# Patient Record
Sex: Male | Born: 1975 | Race: Black or African American | Hispanic: Yes | Marital: Married | Smoking: Never smoker
Health system: Southern US, Community
[De-identification: ages and names within clinical notes are randomized; demographics above are authoritative.]

## PROBLEM LIST (undated history)

## (undated) DIAGNOSIS — G40909 Epilepsy, unspecified, not intractable, without status epilepticus: Secondary | ICD-10-CM

## (undated) HISTORY — PX: ANTERIOR CRUCIATE LIGAMENT REPAIR: SHX115

---

## 2013-05-06 ENCOUNTER — Encounter (HOSPITAL_COMMUNITY): Payer: Self-pay | Admitting: *Deleted

## 2013-05-06 ENCOUNTER — Emergency Department (HOSPITAL_COMMUNITY)
Admission: EM | Admit: 2013-05-06 | Discharge: 2013-05-06 | Disposition: A | Discharge status: Home / Self Care | Payer: BC Managed Care – PPO | Attending: Emergency Medicine | Admitting: Emergency Medicine

## 2013-05-06 DIAGNOSIS — R509 Fever, unspecified: Secondary | ICD-10-CM | POA: Insufficient documentation

## 2013-05-06 DIAGNOSIS — L02419 Cutaneous abscess of limb, unspecified: Secondary | ICD-10-CM | POA: Insufficient documentation

## 2013-05-06 DIAGNOSIS — R112 Nausea with vomiting, unspecified: Secondary | ICD-10-CM | POA: Insufficient documentation

## 2013-05-06 DIAGNOSIS — L03116 Cellulitis of left lower limb: Secondary | ICD-10-CM

## 2013-05-06 DIAGNOSIS — M7989 Other specified soft tissue disorders: Secondary | ICD-10-CM

## 2013-05-06 DIAGNOSIS — M79609 Pain in unspecified limb: Secondary | ICD-10-CM

## 2013-05-06 DIAGNOSIS — Z8669 Personal history of other diseases of the nervous system and sense organs: Secondary | ICD-10-CM | POA: Insufficient documentation

## 2013-05-06 DIAGNOSIS — R197 Diarrhea, unspecified: Secondary | ICD-10-CM | POA: Insufficient documentation

## 2013-05-06 HISTORY — DX: Epilepsy, unspecified, not intractable, without status epilepticus: G40.909

## 2013-05-06 LAB — CBC WITH DIFFERENTIAL/PLATELET
Basophils Absolute: 0.1 10*3/uL (ref 0.0–0.1)
HCT: 44.9 % (ref 39.0–52.0)
Lymphocytes Relative: 16 % (ref 12–46)
Lymphs Abs: 1.9 10*3/uL (ref 0.7–4.0)
MCV: 84.6 fL (ref 78.0–100.0)
Monocytes Absolute: 1.2 10*3/uL — ABNORMAL HIGH (ref 0.1–1.0)
Neutro Abs: 8.3 10*3/uL — ABNORMAL HIGH (ref 1.7–7.7)
RBC: 5.31 MIL/uL (ref 4.22–5.81)
RDW: 14.5 % (ref 11.5–15.5)
WBC: 11.6 10*3/uL — ABNORMAL HIGH (ref 4.0–10.5)

## 2013-05-06 LAB — POCT I-STAT, CHEM 8
BUN: 9 mg/dL (ref 6–23)
Chloride: 108 mEq/L (ref 96–112)
Creatinine, Ser: 1.1 mg/dL (ref 0.50–1.35)
Sodium: 140 mEq/L (ref 135–145)

## 2013-05-06 MED ORDER — CLINDAMYCIN PHOSPHATE 600 MG/50ML IV SOLN
600.0000 mg | Freq: Once | INTRAVENOUS | Status: AC
  Administered 2013-05-06: 600 mg via INTRAVENOUS
  Filled 2013-05-06: qty 50

## 2013-05-06 MED ORDER — HYDROCODONE-ACETAMINOPHEN 5-325 MG PO TABS
1.0000 | ORAL_TABLET | Freq: Once | ORAL | Status: AC
  Administered 2013-05-06: 1 via ORAL
  Filled 2013-05-06: qty 1

## 2013-05-06 MED ORDER — MORPHINE SULFATE 4 MG/ML IJ SOLN
4.0000 mg | Freq: Once | INTRAMUSCULAR | Status: AC
  Administered 2013-05-06: 4 mg via INTRAVENOUS
  Filled 2013-05-06: qty 1

## 2013-05-06 MED ORDER — HYDROCODONE-ACETAMINOPHEN 5-325 MG PO TABS: 1.0000 | ORAL_TABLET | ORAL | Status: AC | PRN

## 2013-05-06 MED ORDER — CLINDAMYCIN HCL 150 MG PO CAPS: 150.0000 mg | ORAL_CAPSULE | Freq: Four times a day (QID) | ORAL | Status: AC

## 2013-05-06 MED ORDER — SODIUM CHLORIDE 0.9 % IV BOLUS (SEPSIS)
1000.0000 mL | Freq: Once | INTRAVENOUS | Status: AC
  Administered 2013-05-06: 1000 mL via INTRAVENOUS

## 2013-05-06 NOTE — ED Provider Notes (Signed)
CSN: [id number]     Arrival date & time 05/06/13  1218 History     First MD Initiated Contact with Patient 05/06/13 1259     Chief Complaint  Patient presents with  . Leg Pain   (Consider location/radiation/quality/duration/timing/severity/associated sxs/prior Treatment) HPI  37 year old male presents c/o leg swelling.  Patient states 3 days ago he felt like he had food poisoning after eating undercooked hamburger. His symptoms include subjective fever, body aches, chills, nausea vomiting and diarrhea. Symptoms lasting for a day and has improved. However he notice pain, redness and swelling to his left lower extremities. Redness started about 2 days ago primarily in the front of his leg but now has spread throughout most of his lower leg. Swelling has also increased as well. Pain is described as a throbbing sensation. Pain is worsened after he stands for prolonged period time. Pain is now 4/10.  He has tried over-the-counter medication, ibuprofen, with some relief. Patient currently now denies fever, chills, chest pain, shortness of breath, productive cough, hemoptysis, numbness or weakness. No history of diabetes. No recent trauma. No prior history of concern for DVT or PE including no known cancer, recent surgery, prolonged bed rest, recent long trip.  Past Medical History  Diagnosis Date  . Epilepsy     as a child   Past Surgical History  Procedure Laterality Date  . Anterior cruciate ligament repair      left 2005   History reviewed. No pertinent family history. History  Substance Use Topics  . Smoking status: Never Smoker   . Smokeless tobacco: Not on file  . Alcohol Use: No    Review of Systems  All other systems reviewed and are negative.    Allergies  Review of patient's allergies indicates no known allergies.  Home Medications   Current Outpatient Rx  Name  Route  Sig  Dispense  Refill  . ibuprofen (ADVIL,MOTRIN) 200 MG tablet   Oral   Take 600 mg by mouth  every 6 (six) hours as needed for pain.          BP 124/78  Pulse 87  Temp(Src) 98.8 F (37.1 C) (Oral)  Resp 16  SpO2 95% Physical Exam  Nursing note and vitals reviewed. Constitutional: He is oriented to person, place, and time. He appears well-developed and well-nourished. No distress.  HENT:  Head: Atraumatic.  Eyes: Conjunctivae are normal.  Neck: Neck supple.  Cardiovascular: Normal rate and regular rhythm.   Pulmonary/Chest: Effort normal and breath sounds normal.  Abdominal: Soft.  Musculoskeletal: He exhibits tenderness (Left lower extremities. Moderate blanchable erythema throughout left lower leg from below the knee to the top of his ankle. No abscess, petechia, vesicular lesions.  ttp.  mild edema noted.  intact distal pulses).  Neurological: He is alert and oriented to person, place, and time.  Skin: Skin is warm.  Psychiatric: He has a normal mood and affect.    ED Course   Procedures (including critical care time)  1:50 PM Pt with evidence of cellulitis to LLE.  Cellulitic area has been marked.  Will also obtain doppler study to r/o DVT.  Will start IV clindamycin, check basic labs, and give pain meds.  Care discussed with attending.  2:34 PM Venous doppler study negative for DVT.  WBC minimally elevated at 11.6.  Electrolytes and H&H are reassuring.    2:38 PM Alize, Acy Male Sep 07, 1976 [id number]            Progress Notes  signed by Gara Kroner, RVT at 05/06/2013 2:24 PM    Author: Gara Kroner, RVT Service: Vascular Lab Author Type: Cardiovascular Sonographer   Filed: 05/06/2013 2:24 PM Note Time: 05/06/2013 2:24 PM         VASCULAR LAB  PRELIMINARY PRELIMINARY PRELIMINARY PRELIMINARY  Left lower extremity venous duplex completed.  Preliminary report: Left: No evidence of DVT, superficial thrombosis, or Baker's cyst.  CESTONE, HELENE, RVT  05/06/2013, 2:24 PM      Labs Reviewed  CBC WITH DIFFERENTIAL - Abnormal; Notable for the  following:    WBC 11.6 (*)    Neutro Abs 8.3 (*)    Monocytes Absolute 1.2 (*)    All other components within normal limits  POCT I-STAT, CHEM 8   No results found. 1. Cellulitis of left leg without foot     MDM  BP 124/78  Pulse 87  Temp(Src) 98.8 F (37.1 C) (Oral)  Resp 16  SpO2 95%  I have reviewed nursing notes and vital signs. I personally reviewed the imaging tests through PACS system  I reviewed available ER/hospitalization records thought the EMR   Fayrene Helper, [place] 05/06/13 1457

## 2013-05-06 NOTE — Progress Notes (Signed)
VASCULAR LAB PRELIMINARY  PRELIMINARY  PRELIMINARY  PRELIMINARY  Left lower extremity venous duplex completed.    Preliminary report:  Left:  No evidence of DVT, superficial thrombosis, or Baker's cyst.  Bianey Tesoro, RVT 05/06/2013, 2:24 PM

## 2013-05-06 NOTE — ED Notes (Signed)
Pt reports that he thought he had food poisoning on Sunday. Since then has been febrile and vomiting. On Monday woke left lower leg reddness. reddness and swelling has progressively gotten worse. Pain 4/10.

## 2013-05-06 NOTE — ED Provider Notes (Signed)
Medical screening examination/treatment/procedure(s) were performed by non-physician practitioner and as supervising physician I was immediately available for consultation/collaboration.   Esbeidy Mclaine H Harley Fitzwater, MD 05/06/13 1559 

## 2016-08-11 ENCOUNTER — Encounter (HOSPITAL_COMMUNITY): Payer: Self-pay

## 2016-08-11 ENCOUNTER — Emergency Department (HOSPITAL_COMMUNITY)
Admission: EM | Admit: 2016-08-11 | Discharge: 2016-08-12 | Disposition: A | Discharge status: Home / Self Care | Payer: BLUE CROSS/BLUE SHIELD | Attending: Emergency Medicine | Admitting: Emergency Medicine

## 2016-08-11 DIAGNOSIS — M79605 Pain in left leg: Secondary | ICD-10-CM | POA: Insufficient documentation

## 2016-08-11 DIAGNOSIS — Y92410 Unspecified street and highway as the place of occurrence of the external cause: Secondary | ICD-10-CM | POA: Insufficient documentation

## 2016-08-11 DIAGNOSIS — Y939 Activity, unspecified: Secondary | ICD-10-CM | POA: Diagnosis not present

## 2016-08-11 DIAGNOSIS — S0990XA Unspecified injury of head, initial encounter: Secondary | ICD-10-CM | POA: Diagnosis present

## 2016-08-11 DIAGNOSIS — S169XXA Unspecified injury of muscle, fascia and tendon at neck level, initial encounter: Secondary | ICD-10-CM | POA: Diagnosis not present

## 2016-08-11 DIAGNOSIS — S299XXA Unspecified injury of thorax, initial encounter: Secondary | ICD-10-CM | POA: Diagnosis not present

## 2016-08-11 DIAGNOSIS — Y999 Unspecified external cause status: Secondary | ICD-10-CM | POA: Diagnosis not present
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
## 2016-08-12 ENCOUNTER — Emergency Department (HOSPITAL_COMMUNITY): Payer: BLUE CROSS/BLUE SHIELD

## 2016-08-12 DIAGNOSIS — S169XXA Unspecified injury of muscle, fascia and tendon at neck level, initial encounter: Secondary | ICD-10-CM | POA: Diagnosis not present

## 2016-08-12 MED ORDER — OXYCODONE-ACETAMINOPHEN 5-325 MG PO TABS: 1.0000 | ORAL_TABLET | Freq: Four times a day (QID) | ORAL | 0 refills | Status: AC | PRN

## 2016-08-12 MED ORDER — CYCLOBENZAPRINE HCL 10 MG PO TABS: 10.0000 mg | ORAL_TABLET | Freq: Two times a day (BID) | ORAL | 0 refills | Status: AC | PRN

## 2016-08-12 NOTE — ED Notes (Addendum)
Pt departed in NAD, refused use of wheelchair. [facility] experiencing technical malfunction on computer in room, so unable to obtain signature for d/c.

## 2016-08-12 NOTE — ED Notes (Signed)
Patient transported to X-ray 
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
## 2018-02-23 ENCOUNTER — Encounter (HOSPITAL_COMMUNITY): Payer: Self-pay

## 2018-02-23 ENCOUNTER — Emergency Department (HOSPITAL_COMMUNITY)
Admission: EM | Admit: 2018-02-23 | Discharge: 2018-02-23 | Disposition: A | Discharge status: Home / Self Care | Payer: BLUE CROSS/BLUE SHIELD | Attending: Emergency Medicine | Admitting: Emergency Medicine

## 2018-02-23 DIAGNOSIS — R55 Syncope and collapse: Secondary | ICD-10-CM | POA: Diagnosis not present

## 2018-02-23 DIAGNOSIS — Z79899 Other long term (current) drug therapy: Secondary | ICD-10-CM | POA: Diagnosis not present

## 2018-02-23 LAB — CBC
HEMATOCRIT: 46 % (ref 39.0–52.0)
Hemoglobin: 14.8 g/dL (ref 13.0–17.0)
MCH: 27.4 pg (ref 26.0–34.0)
MCHC: 32.2 g/dL (ref 30.0–36.0)
MCV: 85 fL (ref 78.0–100.0)
Platelets: 308 10*3/uL (ref 150–400)
RBC: 5.41 MIL/uL (ref 4.22–5.81)
RDW: 14 % (ref 11.5–15.5)
WBC: 12 10*3/uL — ABNORMAL HIGH (ref 4.0–10.5)

## 2018-02-23 LAB — BASIC METABOLIC PANEL
Anion gap: 9 (ref 5–15)
BUN: 17 mg/dL (ref 6–20)
CHLORIDE: 109 mmol/L (ref 101–111)
CO2: 21 mmol/L — ABNORMAL LOW (ref 22–32)
Calcium: 9.2 mg/dL (ref 8.9–10.3)
Creatinine, Ser: 1.16 mg/dL (ref 0.61–1.24)
GFR calc Af Amer: >60 mL/min (ref >60)
GFR calc non Af Amer: >60 mL/min (ref >60)
GLUCOSE: 91 mg/dL (ref 65–99)
POTASSIUM: 3.7 mmol/L (ref 3.5–5.1)
Sodium: 139 mmol/L (ref 135–145)

## 2018-02-23 LAB — URINALYSIS, ROUTINE W REFLEX MICROSCOPIC
BILIRUBIN URINE: NEGATIVE
GLUCOSE, UA: NEGATIVE mg/dL
HGB URINE DIPSTICK: NEGATIVE
KETONES UR: 20 mg/dL — AB
Leukocytes, UA: NEGATIVE
Nitrite: NEGATIVE
PH: 5 (ref 5.0–8.0)
Protein, ur: NEGATIVE mg/dL
Specific Gravity, Urine: 1.027 (ref 1.005–1.030)

## 2018-02-23 LAB — I-STAT TROPONIN, ED: Troponin i, poc: 0 ng/mL (ref 0.00–0.08)

## 2018-02-23 LAB — CBG MONITORING, ED: Glucose-Capillary: 91 mg/dL (ref 65–99)

## 2018-02-23 MED ORDER — SODIUM CHLORIDE 0.9 % IV BOLUS
1000.0000 mL | Freq: Once | INTRAVENOUS | Status: AC
  Administered 2018-02-23: 1000 mL via INTRAVENOUS

## 2018-02-23 NOTE — ED Notes (Signed)
Pt reports tingling in bilateral hands

## 2018-02-23 NOTE — ED Provider Notes (Signed)
[facility] EMERGENCY DEPARTMENT Provider Note   CSN: [id number] Arrival date & time: 02/23/18  1220     History   Chief Complaint Chief Complaint  Patient presents with  . Near Syncope    HPI Garrett Owens is a 42 y.o. male past history of epilepsy as a child brought in by EMS for evaluation of near syncopal episode that occurred just prior to arrival.  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  Patient reports that he fell to the ground.  He states that he was "out of it but did not lose consciousness." Both patient and witnesses state that he did not hit his head.  Witnesses states that there was no seizure-like activity.  Patient reports feeling generally weak all over.  He states that initially, he had some tingling in his bilateral hands.  He states that it is slightly improved since being in the ED.  Patient reports that he has not had an episode like this previously.  He states that he did not eat breakfast this morning.  Otherwise he has been in his normal state of health.  Patient denies any cardiac history.  Patient denies any recent fevers, vision changes, chest pain, difficulty breathing, abdominal pain, nausea/vomiting, dizziness.  The history is provided by the patient.    Past Medical History:  Diagnosis Date  . Epilepsy (HCC)    as a child    There are no active problems to display for this patient.   Past Surgical History:  Procedure Laterality Date  . ANTERIOR CRUCIATE LIGAMENT REPAIR     left 2005        Home Medications    Prior to Admission medications   Medication Sig Start Date End Date Taking? Authorizing Provider  clindamycin (CLEOCIN) 150 MG capsule Take 1 capsule (150 mg total) by mouth every 6 (six) hours. 05/06/13   Fayrene Helper, PA-C  cyclobenzaprine (FLEXERIL) 10 MG tablet Take 1 tablet (10 mg total) by mouth 2 (two) times daily as needed for muscle spasms. 08/12/16   Emi Holes, PA-C  HYDROcodone-acetaminophen (NORCO/VICODIN) 5-325 MG per tablet Take 1 tablet by mouth every 4 (four) hours as needed for pain. 05/06/13   Fayrene Helper, PA-C  ibuprofen (ADVIL,MOTRIN) 200 MG tablet Take 600 mg by mouth every 6 (six) hours as needed for pain.    [provider]  oxyCODONE-acetaminophen (PERCOCET/ROXICET) 5-325 MG tablet Take 1-2 tablets by mouth every 6 (six) hours as needed for severe pain. 08/12/16   Emi Holes, PA-C    Family History No family history on file.  Social History Social History   Tobacco Use  . Smoking status: Never Smoker  . Smokeless tobacco: Never Used  Substance Use Topics  . Alcohol use: No  . Drug use: No     Allergies   Patient has no known allergies.   Review of Systems Review of Systems  Constitutional: Negative for fever.  Respiratory: Negative for cough and shortness of breath.   Cardiovascular: Negative for chest pain.  Gastrointestinal: Negative for abdominal pain, nausea and vomiting.  Genitourinary: Negative for dysuria and hematuria.  Neurological: Positive for syncope and numbness (Tingling to hands). Negative for headaches.     Physical Exam Updated Vital Signs BP 130/83   Pulse 77   Temp 98.2 F (36.8 C) (Oral)   Resp 15   Ht  (1.803 m)   Wt 116.4 kg (256 lb  9.6 oz)   SpO2 97%   BMI 35.79 kg/m   Physical Exam  Constitutional: He is oriented to person, place, and time. He appears well-developed and well-nourished.  HENT:  Head: Normocephalic and atraumatic.  Mouth/Throat: Oropharynx is clear and moist and mucous membranes are normal.  Eyes: Pupils are equal, round, and reactive to light. Conjunctivae, EOM and lids are normal.  Neck: Full passive range of motion without pain.  Cardiovascular: Normal rate, regular rhythm, normal heart sounds and normal pulses. Exam reveals no gallop and no friction rub.  No murmur heard. Pulses:      Radial pulses are 2+ on the right side, and  2+ on the left side.       Dorsalis pedis pulses are 2+ on the right side, and 2+ on the left side.  Pulmonary/Chest: Effort normal and breath sounds normal.  Abdominal: Soft. Normal appearance. There is no tenderness. There is no rigidity and no guarding.  Musculoskeletal: Normal range of motion.  Neurological: He is alert and oriented to person, place, and time.  Cranial nerves III-XII intact Follows commands, Moves all extremities  5/5 strength to BUE and BLE  Sensation intact throughout all major nerve distributions Normal finger to nose. No dysdiadochokinesia. No pronator drift. No gait abnormalities  No slurred speech. No facial droop.   Skin: Skin is warm and dry. Capillary refill takes less than 2 seconds.  Psychiatric: He has a normal mood and affect. His speech is normal.  Nursing note and vitals reviewed.    ED Treatments / Results  Labs (all labs ordered are listed, but only abnormal results are displayed) Labs Reviewed  BASIC METABOLIC PANEL  CBC  URINALYSIS, ROUTINE W REFLEX MICROSCOPIC  CBG MONITORING, ED    EKG EKG Interpretation  Date/Time:   year old male with no significant past medical history presents for evaluation of near syncopal episode that occurred earlier this morning.  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  No preceding chest pain or dizziness.  Patient reports that he almost passed out.  He states he did not lose consciousness or nor hit his head.  Witnesses states that there was no tonic-clonic seizure activity.  Patient reports he did not eat breakfast today.  Reports feeling better on ED arrival but states still somewhat lightheaded.  No dizziness, chest pain, numbness/weakness. Patient is afebrile, non-toxic appearing, sitting comfortably on examination table. Vital signs reviewed and stable.  No neuro deficits noted on exam.  Consider near syncope versus glycemia versus dehydration versus orthostatic hypotension.  Do not suspect CVA, seizure.  Low suspicion for ACS etiology given exam but will plan to check EKG, troponin.  Plan to check basic labs.  IV fluids given.  BMP shows bicarb of 21.  Otherwise unremarkable.  CBC shows blood glucose at 91.  I-STAT troponin negative.  CBC shows slight leukocytosis.  Otherwise unremarkable.  UA negative for any acute infectious etiology.  His EKG still shows some borderline ST elevations.  There are no priors for comparison.  He is not complaining of any chest pain and has no evidence of elevated troponin.  No evidence of any arrhythmia that could be causing syncopal episodes.  Discussed results with patient and wife.  He reports feeling better after fluids.  We will plan to p.o. challenge and ambulate patient in the department.  Patient able to tolerate p.o. without any difficulty.  Patient able to ambulate in the bathroom without any difficulty.  Denies any symptoms with ambulation.  Repeat vitals are stable.  Neuro exam shows no deficits.  Patient negative for  all criteria of [place] syncope rule.  He is at low risk for any adverse effect.  Given low risk score, history/physical exam, patient stable for discharge at this time.  Discussed plan with patient.  He is agreeable.  He does not have a primary care doctor.  We will give an outpatient [facility] for him to follow-up with regarding primary care evaluation. Patient had ample opportunity for questions and discussion. All patient's questions were answered with full understanding. Strict return precautions discussed. Patient expresses understanding and agreement to plan.     Orthostatic VS for the past 24 hrs:  BP- Lying Pulse- Lying BP- Sitting Pulse- Sitting BP- Standing at 0 minutes Pulse- Standing at 0 minutes  02/23/18 1321 132/72 78 128/87 86 130/85 91      Final Clinical Impressions(s) / ED Diagnoses   Final diagnoses:  Near syncope    ED Discharge Orders    None       Rosana Hoes 02/23/18 2341    Terrilee Files, MD 02/24/18 1150

## 2018-02-23 NOTE — ED Triage Notes (Signed)
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 Pt denies passing out or hitting his head. Pt a.o upon ems arrival. VSS, nad 20G LFA

## 2018-02-23 NOTE — ED Notes (Signed)
Pt given graham crackers and ice water 

## 2018-02-23 NOTE — Discharge Instructions (Signed)
Make sure you are staying hydrated and drinking plenty of fluids.  Follow-up with referred [facility] for further evaluation.  Return the emergency department for any additional episodes like today, chest pain, difficulty breathing, numbness/weakness in her extremities, vision changes, difficulty speaking or any other worsening or concerning symptoms.

## 2018-02-23 NOTE — ED Notes (Signed)
Pt ambulated to the restroom without difficulty. Gait steady/even. 

## 2019-12-18 ENCOUNTER — Ambulatory Visit: Payer: BC Managed Care – PPO | Attending: Internal Medicine

## 2019-12-18 DIAGNOSIS — Z23 Encounter for immunization: Secondary | ICD-10-CM

## 2019-12-18 NOTE — Progress Notes (Signed)
   Covid-19 Vaccination Clinic  Name:  Garrett Owens    MRN: [id number] DOB: Jan 05, 1976  12/18/2019  [name] was observed post Covid-19 immunization for 15 minutes without incident. He was provided with Vaccine Information Sheet and instruction to access the V-Safe system.   [name] was instructed to call 911 with any severe reactions post vaccine: Marland Kitchen Difficulty breathing  . Swelling of face and throat  . A fast heartbeat  . A bad rash all over body  . Dizziness and weakness   Immunizations Administered    Name Date Dose VIS Date Route   Pfizer COVID-19 Vaccine 12/18/2019  3:40 PM 0.3 mL 09/18/2019 Intramuscular   Manufacturer: [employer], [employer]   Lot: [id number]   NDC: [address]-8

## 2020-01-12 ENCOUNTER — Ambulatory Visit: Payer: BC Managed Care – PPO | Attending: Internal Medicine

## 2020-01-12 DIAGNOSIS — Z23 Encounter for immunization: Secondary | ICD-10-CM

## 2020-01-12 NOTE — Progress Notes (Signed)
   Covid-19 Vaccination Clinic  Name:  [name]    MRN: [id number] DOB: 11-07-1975  01/12/2020  Mr. [name] was observed post Covid-19 immunization for 15 minutes without incident. He was provided with Vaccine Information Sheet and instruction to access the V-Safe system.   Mr. [name] was instructed to call 911 with any severe reactions post vaccine: Marland Kitchen Difficulty breathing  . Swelling of face and throat  . A fast heartbeat  . A bad rash all over body  . Dizziness and weakness   Immunizations Administered    Name Date Dose VIS Date Route   Pfizer COVID-19 Vaccine 01/12/2020  3:23 PM 0.3 mL 09/18/2019 Intramuscular   Manufacturer: [employer], [employer]   Lot: [id number]   NDC: [address]-0

## 2023-06-29 ENCOUNTER — Emergency Department (HOSPITAL_BASED_OUTPATIENT_CLINIC_OR_DEPARTMENT_OTHER)
Admission: EM | Admit: 2023-06-29 | Discharge: 2023-06-29 | Disposition: A | Discharge status: Home / Self Care | Payer: BC Managed Care – PPO | Attending: Emergency Medicine | Admitting: Emergency Medicine

## 2023-06-29 ENCOUNTER — Emergency Department (HOSPITAL_BASED_OUTPATIENT_CLINIC_OR_DEPARTMENT_OTHER): Payer: BC Managed Care – PPO

## 2023-06-29 ENCOUNTER — Encounter (HOSPITAL_BASED_OUTPATIENT_CLINIC_OR_DEPARTMENT_OTHER): Payer: Self-pay | Admitting: Emergency Medicine

## 2023-06-29 DIAGNOSIS — Z23 Encounter for immunization: Secondary | ICD-10-CM | POA: Insufficient documentation

## 2023-06-29 DIAGNOSIS — S80812A Abrasion, left lower leg, initial encounter: Secondary | ICD-10-CM | POA: Insufficient documentation

## 2023-06-29 DIAGNOSIS — Y92410 Unspecified street and highway as the place of occurrence of the external cause: Secondary | ICD-10-CM | POA: Insufficient documentation

## 2023-06-29 DIAGNOSIS — S30810A Abrasion of lower back and pelvis, initial encounter: Secondary | ICD-10-CM | POA: Diagnosis not present

## 2023-06-29 DIAGNOSIS — S80811A Abrasion, right lower leg, initial encounter: Secondary | ICD-10-CM | POA: Diagnosis not present

## 2023-06-29 DIAGNOSIS — S40812A Abrasion of left upper arm, initial encounter: Secondary | ICD-10-CM | POA: Diagnosis not present

## 2023-06-29 DIAGNOSIS — S40811A Abrasion of right upper arm, initial encounter: Secondary | ICD-10-CM | POA: Insufficient documentation

## 2023-06-29 DIAGNOSIS — T07XXXA Unspecified multiple injuries, initial encounter: Secondary | ICD-10-CM

## 2023-06-29 MED ORDER — BACITRACIN ZINC 500 UNIT/GM EX OINT
TOPICAL_OINTMENT | Freq: Two times a day (BID) | CUTANEOUS | Status: DC
  Filled 2023-06-29: qty 85.05

## 2023-06-29 MED ORDER — TETANUS-DIPHTH-ACELL PERTUSSIS 5-2.5-18.5 LF-MCG/0.5 IM SUSY
0.5000 mL | PREFILLED_SYRINGE | Freq: Once | INTRAMUSCULAR | Status: AC
  Administered 2023-06-29: 0.5 mL via INTRAMUSCULAR
  Filled 2023-06-29: qty 0.5

## 2023-06-29 MED ORDER — ACETAMINOPHEN 325 MG PO TABS
650.0000 mg | ORAL_TABLET | Freq: Once | ORAL | Status: AC
  Administered 2023-06-29: 650 mg via ORAL
  Filled 2023-06-29: qty 2
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
## 2023-08-05 ENCOUNTER — Ambulatory Visit: Payer: BC Managed Care – PPO | Admitting: Family Medicine

## 2023-08-08 ENCOUNTER — Ambulatory Visit: Payer: BC Managed Care – PPO | Admitting: Family Medicine

## 2023-08-19 ENCOUNTER — Ambulatory Visit: Payer: BC Managed Care – PPO | Admitting: Family Medicine
